# Patient Record
Sex: Female | Born: 1980 | Race: White | Hispanic: No | Marital: Single | Smoking: Never smoker
Health system: Southern US, Community
[De-identification: ages and names within clinical notes are randomized; demographics above are authoritative.]

## PROBLEM LIST (undated history)

## (undated) DIAGNOSIS — C8190 Hodgkin lymphoma, unspecified, unspecified site: Secondary | ICD-10-CM

---
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
## 2002-01-10 ENCOUNTER — Encounter (HOSPITAL_COMMUNITY): Admission: RE | Admit: 2002-01-10 | Discharge: 2002-04-10 | Payer: Self-pay | Admitting: Radiation Oncology

## 2002-02-22 ENCOUNTER — Encounter: Payer: Self-pay | Admitting: Oncology

## 2002-02-22 ENCOUNTER — Ambulatory Visit (HOSPITAL_COMMUNITY): Admission: RE | Admit: 2002-02-22 | Discharge: 2002-02-22 | Payer: Self-pay | Admitting: Oncology

## 2002-05-31 ENCOUNTER — Encounter: Payer: Self-pay | Admitting: Oncology

## 2002-05-31 ENCOUNTER — Ambulatory Visit (HOSPITAL_COMMUNITY): Admission: RE | Admit: 2002-05-31 | Discharge: 2002-05-31 | Payer: Self-pay | Admitting: Oncology

## 2002-08-30 ENCOUNTER — Ambulatory Visit (HOSPITAL_COMMUNITY): Admission: RE | Admit: 2002-08-30 | Discharge: 2002-08-30 | Payer: Self-pay | Admitting: Oncology

## 2002-08-30 ENCOUNTER — Encounter: Payer: Self-pay | Admitting: Oncology

## 2002-11-16 ENCOUNTER — Encounter: Payer: Self-pay | Admitting: Oncology

## 2002-11-16 ENCOUNTER — Ambulatory Visit (HOSPITAL_COMMUNITY): Admission: RE | Admit: 2002-11-16 | Discharge: 2002-11-16 | Payer: Self-pay | Admitting: Oncology

## 2002-11-23 ENCOUNTER — Encounter: Payer: Self-pay | Admitting: Hematology & Oncology

## 2002-11-23 ENCOUNTER — Ambulatory Visit (HOSPITAL_COMMUNITY): Admission: RE | Admit: 2002-11-23 | Discharge: 2002-11-23 | Payer: Self-pay | Admitting: Hematology & Oncology

## 2003-02-27 ENCOUNTER — Ambulatory Visit (HOSPITAL_COMMUNITY): Admission: RE | Admit: 2003-02-27 | Discharge: 2003-02-27 | Payer: Self-pay | Admitting: Oncology

## 2003-02-27 ENCOUNTER — Encounter: Payer: Self-pay | Admitting: Oncology

## 2003-02-28 ENCOUNTER — Ambulatory Visit: Admission: RE | Admit: 2003-02-28 | Discharge: 2003-02-28 | Payer: Self-pay | Admitting: Radiation Oncology

## 2003-06-06 ENCOUNTER — Ambulatory Visit (HOSPITAL_COMMUNITY): Admission: RE | Admit: 2003-06-06 | Discharge: 2003-06-06 | Payer: Self-pay | Admitting: Oncology

## 2003-06-06 ENCOUNTER — Encounter: Payer: Self-pay | Admitting: Oncology

## 2003-06-07 ENCOUNTER — Ambulatory Visit (HOSPITAL_COMMUNITY): Admission: RE | Admit: 2003-06-07 | Discharge: 2003-06-07 | Payer: Self-pay | Admitting: Oncology

## 2003-06-07 ENCOUNTER — Encounter: Payer: Self-pay | Admitting: Oncology

## 2003-10-15 ENCOUNTER — Ambulatory Visit (HOSPITAL_COMMUNITY): Admission: RE | Admit: 2003-10-15 | Discharge: 2003-10-15 | Payer: Self-pay | Admitting: Oncology

## 2004-02-27 ENCOUNTER — Ambulatory Visit (HOSPITAL_COMMUNITY): Admission: RE | Admit: 2004-02-27 | Discharge: 2004-02-27 | Payer: Self-pay | Admitting: Oncology

## 2004-03-12 ENCOUNTER — Ambulatory Visit: Admission: RE | Admit: 2004-03-12 | Discharge: 2004-03-12 | Payer: Self-pay | Admitting: Radiation Oncology

## 2004-10-06 ENCOUNTER — Ambulatory Visit (HOSPITAL_COMMUNITY): Admission: RE | Admit: 2004-10-06 | Discharge: 2004-10-06 | Payer: Self-pay | Admitting: Oncology

## 2004-10-23 ENCOUNTER — Ambulatory Visit: Payer: Self-pay | Admitting: Oncology

## 2005-04-22 ENCOUNTER — Ambulatory Visit: Payer: Self-pay | Admitting: Oncology

## 2005-04-23 ENCOUNTER — Ambulatory Visit (HOSPITAL_COMMUNITY): Admission: RE | Admit: 2005-04-23 | Discharge: 2005-04-23 | Payer: Self-pay | Admitting: Oncology

## 2005-04-30 ENCOUNTER — Ambulatory Visit: Admission: RE | Admit: 2005-04-30 | Discharge: 2005-04-30 | Payer: Self-pay | Admitting: Radiation Oncology

## 2005-10-21 ENCOUNTER — Ambulatory Visit: Payer: Self-pay | Admitting: Oncology

## 2005-10-22 ENCOUNTER — Ambulatory Visit (HOSPITAL_COMMUNITY): Admission: RE | Admit: 2005-10-22 | Discharge: 2005-10-22 | Payer: Self-pay | Admitting: Oncology

## 2006-01-24 ENCOUNTER — Ambulatory Visit (HOSPITAL_COMMUNITY): Admission: RE | Admit: 2006-01-24 | Discharge: 2006-01-24 | Payer: Self-pay | Admitting: Oncology

## 2006-04-25 ENCOUNTER — Ambulatory Visit: Payer: Self-pay | Admitting: Oncology

## 2006-04-28 ENCOUNTER — Ambulatory Visit (HOSPITAL_COMMUNITY): Admission: RE | Admit: 2006-04-28 | Discharge: 2006-04-28 | Payer: Self-pay | Admitting: Oncology

## 2006-04-28 LAB — LACTATE DEHYDROGENASE: LDH: 102 U/L (ref 94–250)

## 2006-04-28 LAB — CBC WITH DIFFERENTIAL/PLATELET
Eosinophils Absolute: 0.2 10*3/uL (ref 0.0–0.5)
HCT: 36.7 % (ref 34.8–46.6)
HGB: 12.6 g/dL (ref 11.6–15.9)
LYMPH%: 34 % (ref 14.0–48.0)
MONO#: 0.5 10*3/uL (ref 0.1–0.9)
NEUT#: 3.2 10*3/uL (ref 1.5–6.5)
NEUT%: 53.7 % (ref 39.6–76.8)
Platelets: 270 10*3/uL (ref 145–400)
WBC: 5.9 10*3/uL (ref 3.9–10.0)
lymph#: 2 10*3/uL (ref 0.9–3.3)

## 2006-04-28 LAB — COMPREHENSIVE METABOLIC PANEL
ALT: 8 U/L (ref 0–40)
AST: 11 U/L (ref 0–37)
Albumin: 4 g/dL (ref 3.5–5.2)
BUN: 9 mg/dL (ref 6–23)
CO2: 24 mEq/L (ref 19–32)
Calcium: 9 mg/dL (ref 8.4–10.5)
Chloride: 101 mEq/L (ref 96–112)
Creatinine, Ser: 0.91 mg/dL (ref 0.40–1.20)
Potassium: 4.1 mEq/L (ref 3.5–5.3)

## 2006-10-26 ENCOUNTER — Ambulatory Visit: Payer: Self-pay | Admitting: Oncology

## 2006-10-31 ENCOUNTER — Ambulatory Visit (HOSPITAL_COMMUNITY): Admission: RE | Admit: 2006-10-31 | Discharge: 2006-10-31 | Payer: Self-pay | Admitting: Oncology

## 2006-10-31 LAB — COMPREHENSIVE METABOLIC PANEL
ALT: 9 U/L (ref 0–35)
AST: 11 U/L (ref 0–37)
Albumin: 4.4 g/dL (ref 3.5–5.2)
CO2: 24 mEq/L (ref 19–32)
Calcium: 9.4 mg/dL (ref 8.4–10.5)
Chloride: 106 mEq/L (ref 96–112)
Potassium: 4 mEq/L (ref 3.5–5.3)
Sodium: 140 mEq/L (ref 135–145)
Total Protein: 7.1 g/dL (ref 6.0–8.3)

## 2006-10-31 LAB — CBC WITH DIFFERENTIAL/PLATELET
BASO%: 0.4 % (ref 0.0–2.0)
Eosinophils Absolute: 0.1 10*3/uL (ref 0.0–0.5)
LYMPH%: 31.3 % (ref 14.0–48.0)
MCHC: 33.6 g/dL (ref 32.0–36.0)
MONO#: 0.4 10*3/uL (ref 0.1–0.9)
NEUT#: 3.6 10*3/uL (ref 1.5–6.5)
Platelets: 305 10*3/uL (ref 145–400)
RBC: 4.51 10*6/uL (ref 3.70–5.32)
WBC: 6.1 10*3/uL (ref 3.9–10.0)
lymph#: 1.9 10*3/uL (ref 0.9–3.3)

## 2006-10-31 LAB — LACTATE DEHYDROGENASE: LDH: 126 U/L (ref 94–250)

## 2006-10-31 LAB — ERYTHROCYTE SEDIMENTATION RATE: Sed Rate: 5 mm/hr (ref 0–30)

## 2007-10-25 ENCOUNTER — Ambulatory Visit: Payer: Self-pay | Admitting: Oncology

## 2007-10-27 ENCOUNTER — Ambulatory Visit (HOSPITAL_COMMUNITY): Admission: RE | Admit: 2007-10-27 | Discharge: 2007-10-27 | Payer: Self-pay | Admitting: Oncology

## 2007-10-27 LAB — COMPREHENSIVE METABOLIC PANEL
Alkaline Phosphatase: 82 U/L (ref 39–117)
CO2: 22 mEq/L (ref 19–32)
Creatinine, Ser: 0.79 mg/dL (ref 0.40–1.20)
Glucose, Bld: 72 mg/dL (ref 70–99)
Sodium: 140 mEq/L (ref 135–145)
Total Bilirubin: 0.7 mg/dL (ref 0.3–1.2)
Total Protein: 7.5 g/dL (ref 6.0–8.3)

## 2007-10-27 LAB — CBC WITH DIFFERENTIAL/PLATELET
Basophils Absolute: 0 10*3/uL (ref 0.0–0.1)
EOS%: 1.7 % (ref 0.0–7.0)
Eosinophils Absolute: 0.1 10*3/uL (ref 0.0–0.5)
HGB: 13.8 g/dL (ref 11.6–15.9)
LYMPH%: 35.5 % (ref 14.0–48.0)
MCH: 30 pg (ref 26.0–34.0)
MCV: 87.6 fL (ref 81.0–101.0)
MONO%: 10.2 % (ref 0.0–13.0)
Platelets: 257 10*3/uL (ref 145–400)
RBC: 4.61 10*6/uL (ref 3.70–5.32)
RDW: 12.8 % (ref 11.3–14.5)

## 2007-10-27 LAB — TSH: TSH: 3.384 u[IU]/mL (ref 0.350–5.500)

## 2007-10-27 LAB — LACTATE DEHYDROGENASE: LDH: 140 U/L (ref 94–250)

## 2008-10-22 ENCOUNTER — Ambulatory Visit (HOSPITAL_COMMUNITY): Admission: RE | Admit: 2008-10-22 | Discharge: 2008-10-22 | Payer: Self-pay | Admitting: Oncology

## 2008-11-11 ENCOUNTER — Ambulatory Visit: Payer: Self-pay | Admitting: Oncology

## 2008-11-13 LAB — COMPREHENSIVE METABOLIC PANEL
Alkaline Phosphatase: 60 U/L (ref 39–117)
BUN: 12 mg/dL (ref 6–23)
Creatinine, Ser: 0.87 mg/dL (ref 0.40–1.20)
Glucose, Bld: 64 mg/dL — ABNORMAL LOW (ref 70–99)
Sodium: 139 mEq/L (ref 135–145)
Total Bilirubin: 0.4 mg/dL (ref 0.3–1.2)

## 2008-11-13 LAB — CBC WITH DIFFERENTIAL/PLATELET
Basophils Absolute: 0 10*3/uL (ref 0.0–0.1)
EOS%: 1.1 % (ref 0.0–7.0)
HCT: 40.3 % (ref 34.8–46.6)
HGB: 14 g/dL (ref 11.6–15.9)
LYMPH%: 32.7 % (ref 14.0–48.0)
MCH: 31.3 pg (ref 26.0–34.0)
MCHC: 34.7 g/dL (ref 32.0–36.0)
MONO#: 0.6 10*3/uL (ref 0.1–0.9)
NEUT%: 57.5 % (ref 39.6–76.8)
Platelets: 256 10*3/uL (ref 145–400)
lymph#: 2.5 10*3/uL (ref 0.9–3.3)

## 2008-11-13 LAB — MORPHOLOGY: PLT EST: ADEQUATE

## 2008-11-13 LAB — ERYTHROCYTE SEDIMENTATION RATE: Sed Rate: 5 mm/hr (ref 0–30)

## 2008-11-13 LAB — LACTATE DEHYDROGENASE: LDH: 119 U/L (ref 94–250)

## 2009-05-05 ENCOUNTER — Ambulatory Visit: Payer: Self-pay | Admitting: Oncology

## 2009-05-08 LAB — COMPREHENSIVE METABOLIC PANEL
AST: 17 U/L (ref 0–37)
Albumin: 3.9 g/dL (ref 3.5–5.2)
Alkaline Phosphatase: 62 U/L (ref 39–117)
Glucose, Bld: 90 mg/dL (ref 70–99)
Potassium: 3.5 mEq/L (ref 3.5–5.3)
Sodium: 136 mEq/L (ref 135–145)
Total Bilirubin: 0.7 mg/dL (ref 0.3–1.2)
Total Protein: 7.5 g/dL (ref 6.0–8.3)

## 2009-05-08 LAB — CBC WITH DIFFERENTIAL/PLATELET
Basophils Absolute: 0 10*3/uL (ref 0.0–0.1)
Eosinophils Absolute: 0.3 10*3/uL (ref 0.0–0.5)
HCT: 38.2 % (ref 34.8–46.6)
MCV: 89.6 fL (ref 79.5–101.0)
MONO#: 0.6 10*3/uL (ref 0.1–0.9)
MONO%: 9.5 % (ref 0.0–14.0)
Platelets: 264 10*3/uL (ref 145–400)
RDW: 12.6 % (ref 11.2–14.5)

## 2009-05-09 LAB — SEDIMENTATION RATE: Sed Rate: 5 mm/hr (ref 0–22)

## 2010-03-14 IMAGING — CT CT ABDOMEN W/ CM
2 of 5 series · 12 of 32 positions shown, 18 images · IV contrast (agent unspecified)
Comparison: CT chest abdomen pelvis 10/27/2007

CT NECK

CLINICAL DATA: Hodgkin's lymphoma diagnosed in 8448.  Chemo
radiation therapy completed.

CT NECK, CHEST, ABDOMEN AND PELVIS WITH CONTRAST
TECHNIQUE: Multidetector CT imaging of the neck, chest, abdomen and
pelvis was performed using the standard protocol following the
bolus administration of intravenous contrast.
Contrast: 100 ml  Omni 300

[Series 2: cap 5.0 b40f st · axial · 0.68mm/px · z∈[+1238,+1744]mm · 9 of 127 slices shown, 15 images]
[im 13/127  soft-tissue]
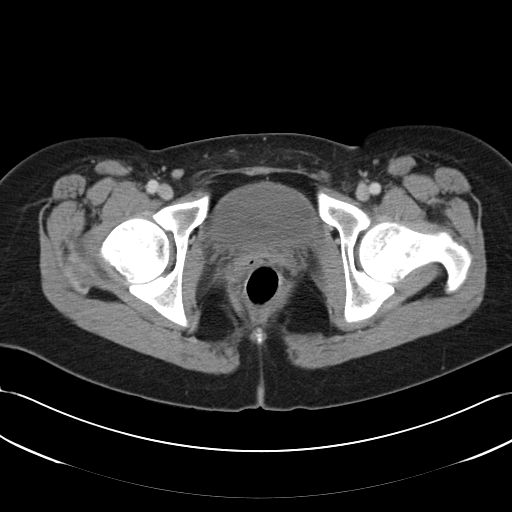
[im 13/127  bone]
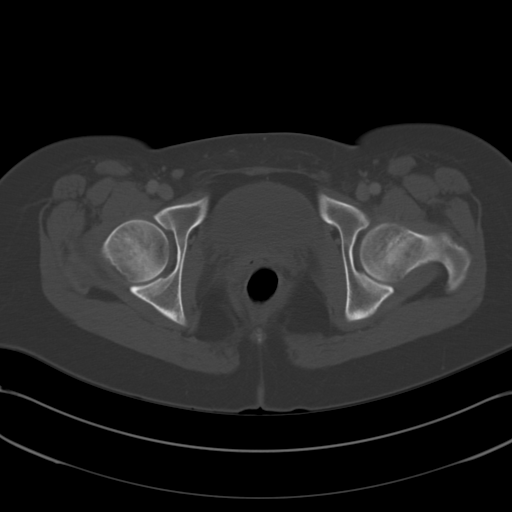
[im 26/127  soft-tissue]
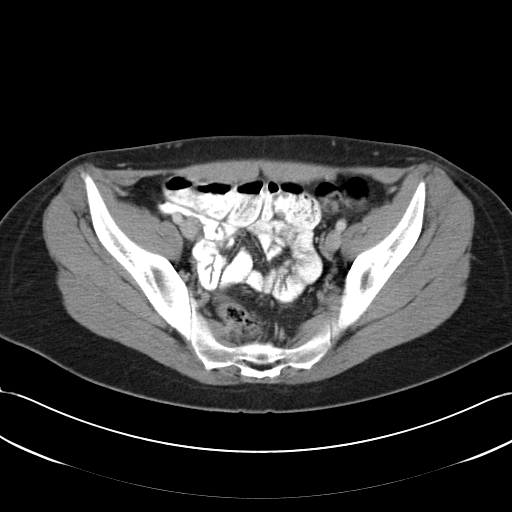
[im 38/127  soft-tissue]
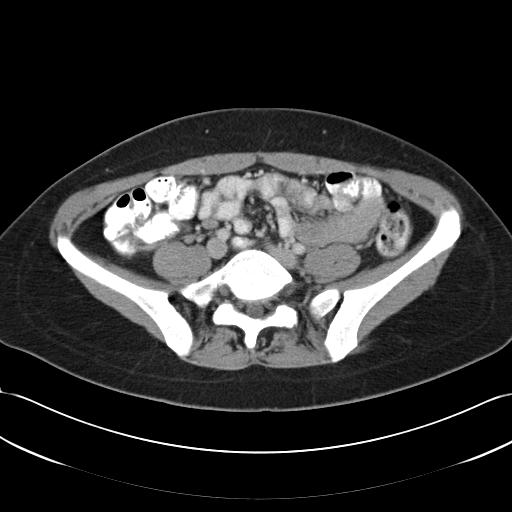
[im 51/127  soft-tissue]
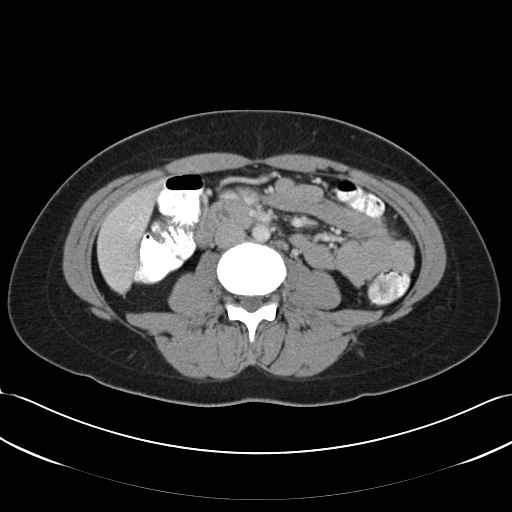
[im 64/127  soft-tissue]
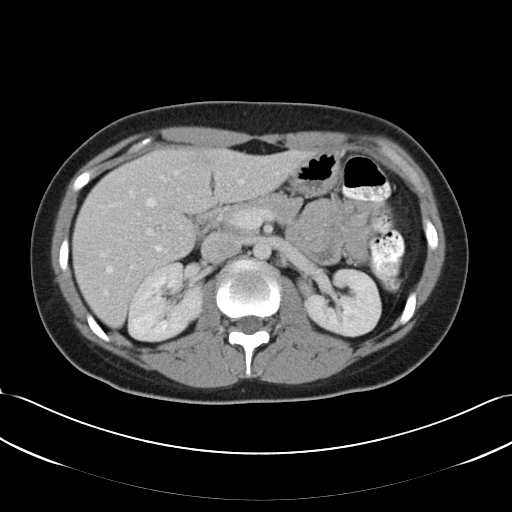
[im 76/127  soft-tissue]
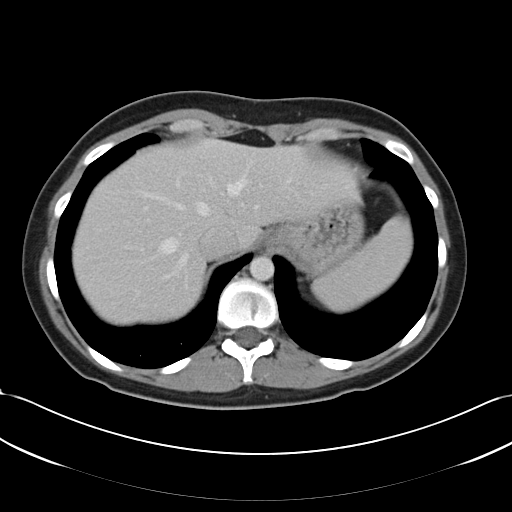
[im 76/127  lung]
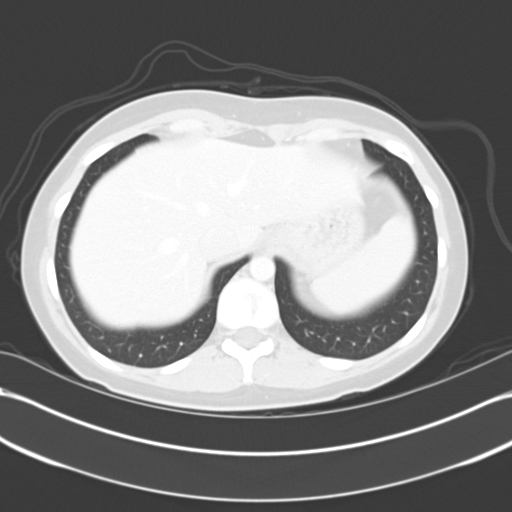
[im 89/127  soft-tissue]
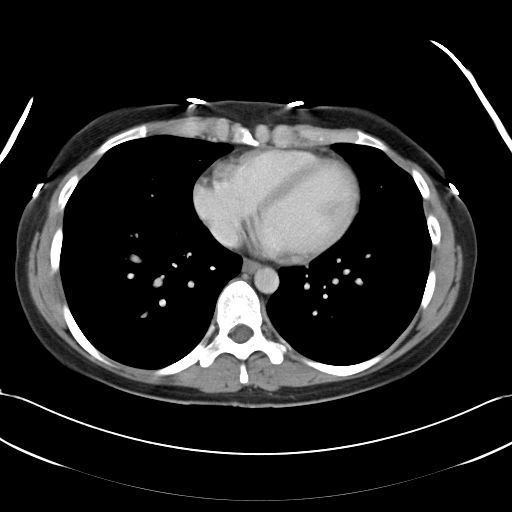
[im 89/127  lung]
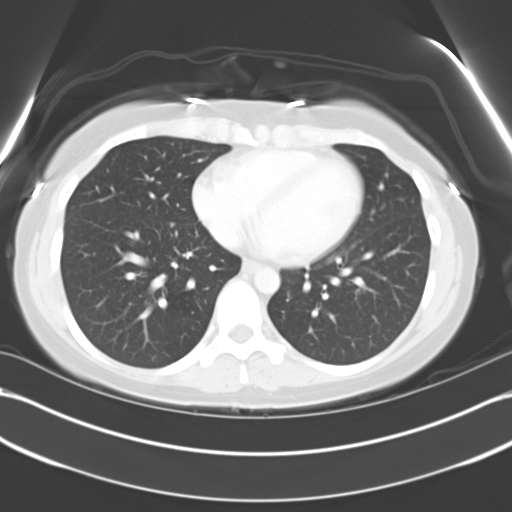
[im 101/127  soft-tissue]
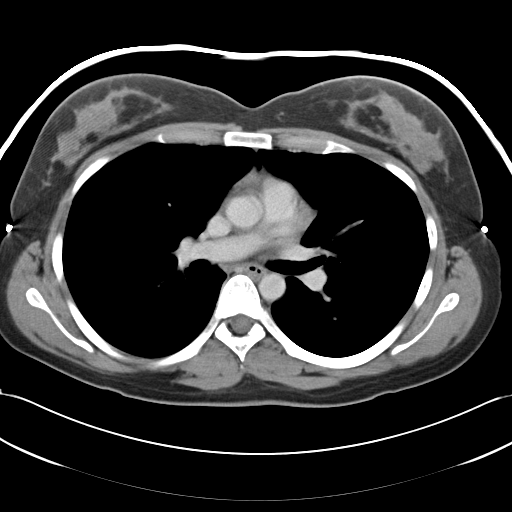
[im 101/127  lung]
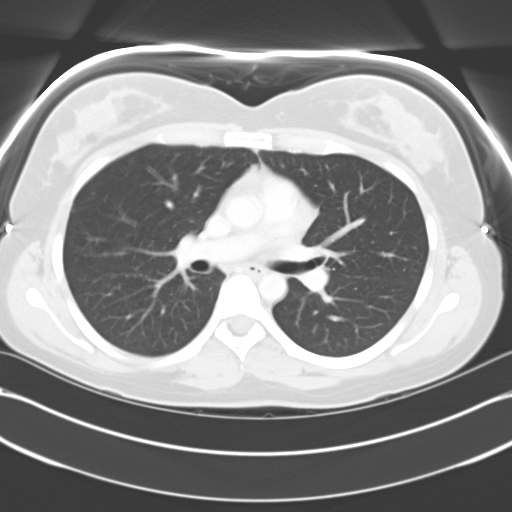
[im 114/127  soft-tissue]
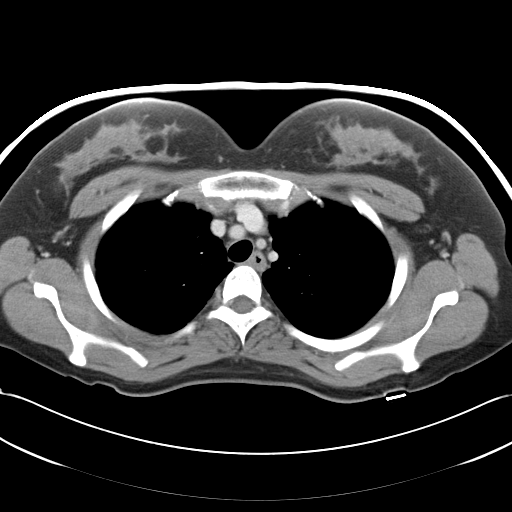
[im 114/127  lung]
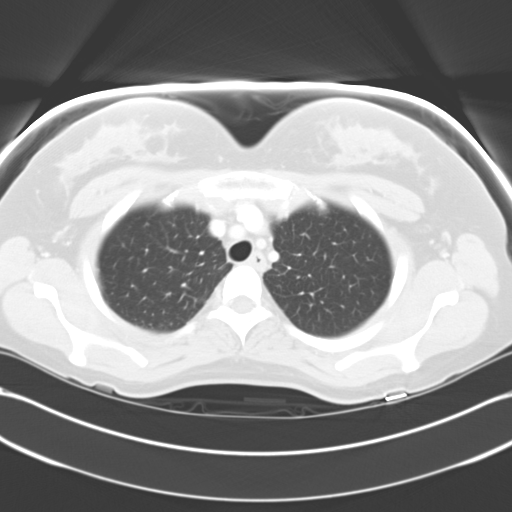
[im 114/127  bone]
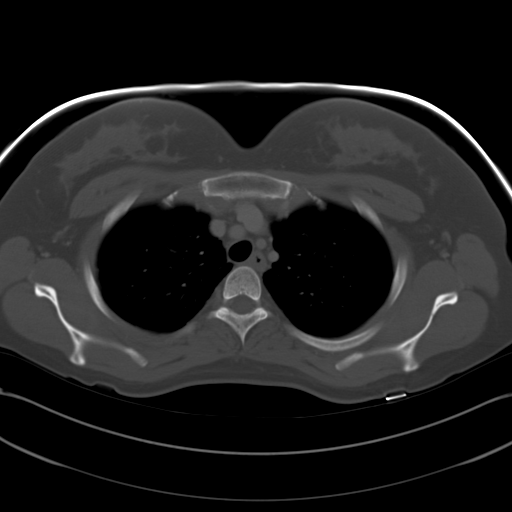

[Series 3: neck_routine 3.0 b40s st · axial · 0.39mm/px · z∈[+1752,+1836]mm · 3 of 84 slices shown]
[im 14/84  soft-tissue]
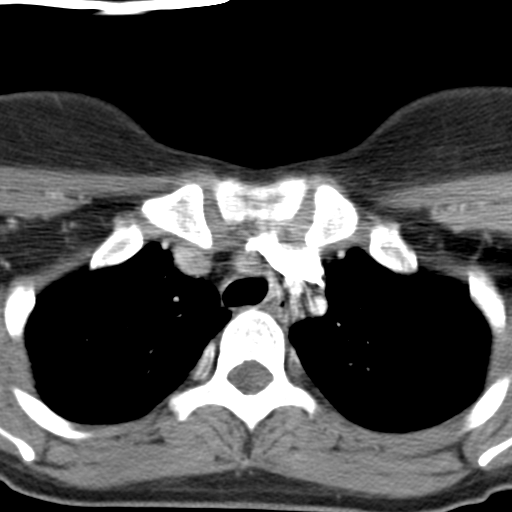
[im 28/84  soft-tissue]
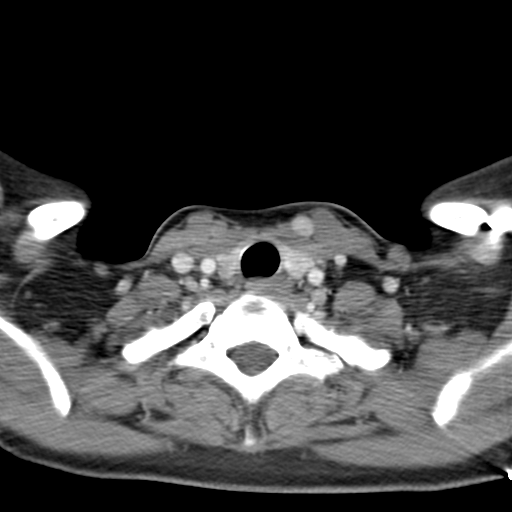
[im 42/84  soft-tissue]
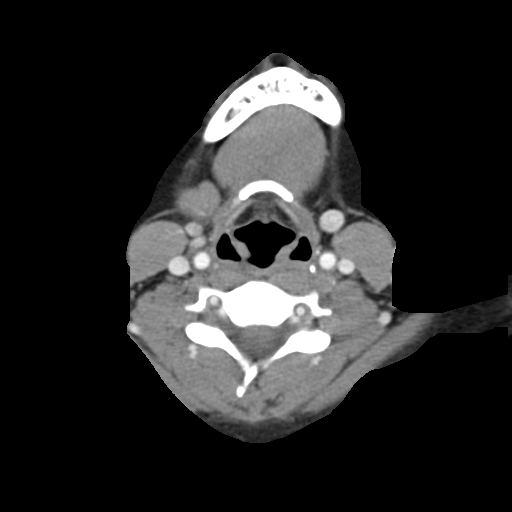

[12 of 32 positions shown; findings below may reference images not displayed]

FINDINGS: No pathologically enlarged lymph nodes along the jugular
digastric chain or submental positions.  There are bilateral sub
centimeter level II nodes which are not pathologic by CT size
criteria.  These are unchanged from prior.  The parapharyngeal
mucosa appears symmetric and normal.  The hypopharynx and glottis
are normal.

Limited view of the brain shows no mass lesion.  Orbits appear
normal.
IMPRESSION: No evidence of lymphoma recurrence.

CT CHEST
FINDINGS: No chest wall abnormality.

No evidence of axillary, supraclavicular, mediastinal, hilar
lymphadenopathy.  A small amount of ill-defined tissue conforming
to the triangular shape of the anterior mediastinum consistent with
residual thymus.  This is decreased in prominence compared to
prior.  No pericardial effusion.

No suspicious pulmonary nodules.  The airway appears normal.
IMPRESSION: No lymphoma recurrence within the thorax.

CT ABDOMEN
FINDINGS: No focal hepatic lesion.  There is fatty infiltration
along the falciform ligament.  The gallbladder, pancreas, spleen,
adrenal glands, and kidneys appear normal.

The stomach, small bowel, appendix, and cecum appear normal.  Colon
appears normal.

Abdominal aorta is normal caliber.  No evidence of retroperitoneal
lymphadenopathy.  There are small periaortic lymph nodes which  are
not pathologically enlarged measuring less than 10 mm and are
unchanged from prior.
IMPRESSION: 1. No evidence of lymphoma recurrence within the abdomen.
2.  Stable small sub centimeter periaortic retroperitoneal lymph
nodes are not changed in size compared to prior.

CT PELVIS
FINDINGS: No free fluid in the pelvis.  The bladder and uterus
appear normal.  There is a low density 25 mm cyst associated with
the right ovary which appears slightly smaller than 29 mm on prior.
No evidence of pelvic lymphadenopathy.  Review of  bone windows
demonstrates no aggressive osseous lesions.
IMPRESSION: 1. No evidence of lymphoma recurrence within the pelvis.
2.  Simple cyst associated with the right ovary likely represents a
functional ovarian cyst.

## 2010-05-05 ENCOUNTER — Ambulatory Visit: Payer: Self-pay | Admitting: Oncology

## 2010-05-07 LAB — CBC WITH DIFFERENTIAL/PLATELET
BASO%: 0.5 % (ref 0.0–2.0)
Basophils Absolute: 0 10*3/uL (ref 0.0–0.1)
Eosinophils Absolute: 0.2 10*3/uL (ref 0.0–0.5)
HCT: 40 % (ref 34.8–46.6)
HGB: 13.6 g/dL (ref 11.6–15.9)
LYMPH%: 41.5 % (ref 14.0–49.7)
MONO#: 0.5 10*3/uL (ref 0.1–0.9)
NEUT#: 2.6 10*3/uL (ref 1.5–6.5)
NEUT%: 45.6 % (ref 38.4–76.8)
Platelets: 260 10*3/uL (ref 145–400)
WBC: 5.6 10*3/uL (ref 3.9–10.3)
lymph#: 2.3 10*3/uL (ref 0.9–3.3)

## 2010-05-08 LAB — COMPREHENSIVE METABOLIC PANEL
ALT: <8 U/L (ref 0–35)
BUN: 9 mg/dL (ref 6–23)
CO2: 19 mEq/L (ref 19–32)
Calcium: 10.1 mg/dL (ref 8.4–10.5)
Chloride: 107 mEq/L (ref 96–112)
Creatinine, Ser: 0.83 mg/dL (ref 0.40–1.20)
Glucose, Bld: 78 mg/dL (ref 70–99)

## 2010-05-08 LAB — TSH: TSH: 3.582 u[IU]/mL (ref 0.350–4.500)

## 2010-05-08 LAB — LACTATE DEHYDROGENASE: LDH: 118 U/L (ref 94–250)

## 2010-11-01 ENCOUNTER — Encounter: Payer: Self-pay | Admitting: Oncology

## 2011-01-25 LAB — PREGNANCY, URINE: Preg Test, Ur: NEGATIVE

## 2011-04-30 ENCOUNTER — Ambulatory Visit
Admission: RE | Admit: 2011-04-30 | Discharge: 2011-04-30 | Disposition: A | Discharge status: Home / Self Care | Payer: BC Managed Care – PPO | Source: Ambulatory Visit | Attending: Radiation Oncology | Admitting: Radiation Oncology

## 2011-07-16 ENCOUNTER — Encounter (HOSPITAL_BASED_OUTPATIENT_CLINIC_OR_DEPARTMENT_OTHER): Payer: BC Managed Care – PPO | Admitting: Oncology

## 2011-07-16 ENCOUNTER — Other Ambulatory Visit: Payer: Self-pay | Admitting: Oncology

## 2011-07-16 DIAGNOSIS — F341 Dysthymic disorder: Secondary | ICD-10-CM

## 2011-07-16 DIAGNOSIS — C8119 Nodular sclerosis classical Hodgkin lymphoma, extranodal and solid organ sites: Secondary | ICD-10-CM

## 2011-07-16 LAB — T3 UPTAKE: T3 Uptake: 32 % (ref 22.5–37.0)

## 2011-07-16 LAB — CBC WITH DIFFERENTIAL/PLATELET
Basophils Absolute: 0 10*3/uL (ref 0.0–0.1)
EOS%: 2.2 % (ref 0.0–7.0)
Eosinophils Absolute: 0.1 10*3/uL (ref 0.0–0.5)
HGB: 14.1 g/dL (ref 11.6–15.9)
MCH: 29.8 pg (ref 25.1–34.0)
NEUT#: 2.3 10*3/uL (ref 1.5–6.5)
RBC: 4.72 10*6/uL (ref 3.70–5.45)
RDW: 12.4 % (ref 11.2–14.5)
lymph#: 1.8 10*3/uL (ref 0.9–3.3)

## 2011-07-16 LAB — COMPREHENSIVE METABOLIC PANEL
AST: 14 U/L (ref 0–37)
Alkaline Phosphatase: 72 U/L (ref 39–117)
BUN: 9 mg/dL (ref 6–23)
Creatinine, Ser: 0.69 mg/dL (ref 0.50–1.10)
Glucose, Bld: 82 mg/dL (ref 70–99)
Total Bilirubin: 0.4 mg/dL (ref 0.3–1.2)

## 2011-07-16 LAB — T4, FREE: Free T4: 1.08 ng/dL (ref 0.80–1.80)

## 2011-07-16 LAB — T3: T3, Total: 99.2 ng/dL (ref 80.0–204.0)

## 2011-07-16 LAB — SEDIMENTATION RATE: Sed Rate: 6 mm/hr (ref 0–22)

## 2011-08-17 LAB — SEDIMENTATION RATE: Sed Rate: 6 mm/hr (ref 0–22)

## 2011-09-13 ENCOUNTER — Telehealth: Payer: Self-pay | Admitting: *Deleted

## 2011-09-13 NOTE — Telephone Encounter (Signed)
left patient voice message to inform the patient of the new date and time on 07-11-2012 for lab and 07-18-2012 for dr.rubin

## 2012-03-13 ENCOUNTER — Telehealth: Payer: Self-pay | Admitting: *Deleted

## 2012-03-13 NOTE — Telephone Encounter (Signed)
per md going to be at [employer] [facility] moved patient appointment to 10-16-201310:00 labs md appointment to follow

## 2012-03-13 NOTE — Telephone Encounter (Signed)
per md going to be at [facility] moved patient appointment to 07-26-2012 10:30am lab one week before the md appointment on 07-19-2012 at 10:00am

## 2012-05-04 ENCOUNTER — Ambulatory Visit
Admission: RE | Admit: 2012-05-04 | Payer: BC Managed Care – PPO | Source: Ambulatory Visit | Admitting: Radiation Oncology

## 2012-05-11 ENCOUNTER — Ambulatory Visit: Payer: BC Managed Care – PPO | Admitting: Radiation Oncology

## 2012-06-15 ENCOUNTER — Ambulatory Visit
Admission: RE | Admit: 2012-06-15 | Discharge: 2012-06-15 | Disposition: A | Discharge status: Home / Self Care | Payer: BC Managed Care – PPO | Source: Ambulatory Visit | Attending: Radiation Oncology | Admitting: Radiation Oncology

## 2012-06-15 ENCOUNTER — Encounter: Payer: Self-pay | Admitting: Radiation Oncology

## 2012-06-15 VITALS — BP 115/60 | HR 72 | Temp 97.9°F | Wt 170.5 lb

## 2012-06-15 DIAGNOSIS — C61 Malignant neoplasm of prostate: Secondary | ICD-10-CM

## 2012-06-15 DIAGNOSIS — C8190 Hodgkin lymphoma, unspecified, unspecified site: Secondary | ICD-10-CM

## 2012-06-15 DIAGNOSIS — R300 Dysuria: Secondary | ICD-10-CM

## 2012-06-15 DIAGNOSIS — K5900 Constipation, unspecified: Secondary | ICD-10-CM

## 2012-06-15 HISTORY — DX: Hodgkin lymphoma, unspecified, unspecified site: C81.90

## 2012-06-15 MED ORDER — POLYETHYLENE GLYCOL 3350 17 GM/SCOOP PO POWD: 17.0000 g | Freq: Every day | ORAL | Status: DC

## 2012-06-15 NOTE — Progress Notes (Signed)
  Radiation Oncology         [phone] ________________________________  Name: Lauren Lane MRN: [id number]  Date: 06/15/2012  DOB: 08-08-1981  Follow-Up Visit Note  CC: No primary provider on file.  Pierce Crane, MD  CC: Pierce Crane, M.D., F.R.C.P.C.   DIAGNOSIS:  This is a 31 year old woman status post ABVD x4 followed by involved-field radiotherapy for stage IIA nodular sclerosing Hodgkin disease.  INTERVAL SINCE LAST RADIATION:  Ten years  NARRATIVE:  Lauren Lane returns today for routine follow-up assessment.  She is essentially without complaint.  She denies B symptoms.  She lives in [place] and is currently attending nursing school. She had a successful pregnancy this past year and has a 86-month-old son.  ALLERGIES:   has no allergies on file.  Meds: Current Outpatient Prescriptions  Medication Sig Dispense Refill  . Prenatal Vitamins (DIS) TABS Take by mouth.        Physical Findings: The patient is in no acute distress. Patient is alert and oriented.  weight is 170 lb 8 oz (77.338 kg). Her temperature is 97.9 F (36.6 C). Her blood pressure is 115/60 and her pulse is 72. Marland Kitchen Oropharynx is symmetric the neck is free of any appreciable lymphadenopathy lungs are clear heart is regular further detailed physical exams deferred today. No significant changes.  Lab Findings: TSH was normal in October 2012 as screening for postradiation hypothyroidism.  Impression/Plan:  The patient has no evidence of disease. Now that she is 10 years status post radiation, she should probably consider enrollment in a high-risk breast cancer prevention clinic. Since she was in [place], we will make a referral to [facility] high-risk breast cancer prevention program. Otherwise, short-term radiation oncology clinic here for routine followup in one year. _____________________________________  Lauren Lane, M.D.

## 2012-06-15 NOTE — Progress Notes (Signed)
Patient here for routine yearly follow up radiation for  Treatment of stage II non sclerosing Hodkin's disease.Patient is looking well and feeling good.Has a one month old son.

## 2012-07-04 ENCOUNTER — Telehealth: Payer: Self-pay | Admitting: *Deleted

## 2012-07-04 NOTE — Telephone Encounter (Signed)
CALLED DR. VICTORIA SEAWALL'S OFFICE TO ARRANGE AN APPT. FOR THIS PATIENT TO BE PUT IN A BREAST CANCER PREVENTION PROGRAM, LVM FOR A RETURN CALL

## 2012-07-05 ENCOUNTER — Telehealth: Payer: Self-pay | Admitting: *Deleted

## 2012-07-05 NOTE — Telephone Encounter (Signed)
patient called in and rescheduled her appointment to 08-16-2012 at 2:00pm

## 2012-07-11 ENCOUNTER — Other Ambulatory Visit: Payer: BC Managed Care – PPO | Admitting: Lab

## 2012-07-18 ENCOUNTER — Ambulatory Visit: Payer: BC Managed Care – PPO | Admitting: Oncology

## 2012-07-18 ENCOUNTER — Other Ambulatory Visit: Payer: Self-pay | Admitting: *Deleted

## 2012-07-18 DIAGNOSIS — C8190 Hodgkin lymphoma, unspecified, unspecified site: Secondary | ICD-10-CM

## 2012-07-19 ENCOUNTER — Other Ambulatory Visit (HOSPITAL_BASED_OUTPATIENT_CLINIC_OR_DEPARTMENT_OTHER): Payer: BC Managed Care – PPO | Admitting: Lab

## 2012-07-19 DIAGNOSIS — C8190 Hodgkin lymphoma, unspecified, unspecified site: Secondary | ICD-10-CM

## 2012-07-19 LAB — CBC WITH DIFFERENTIAL/PLATELET
BASO%: 0.5 % (ref 0.0–2.0)
HCT: 41 % (ref 34.8–46.6)
MCHC: 32.9 g/dL (ref 31.5–36.0)
MONO#: 0.5 10*3/uL (ref 0.1–0.9)
NEUT%: 47.2 % (ref 38.4–76.8)
WBC: 5.8 10*3/uL (ref 3.9–10.3)
lymph#: 2.4 10*3/uL (ref 0.9–3.3)

## 2012-07-19 LAB — COMPREHENSIVE METABOLIC PANEL (CC13)
ALT: 21 U/L (ref 0–55)
Albumin: 3.9 g/dL (ref 3.5–5.0)
CO2: 23 mEq/L (ref 22–29)
Calcium: 10.5 mg/dL — ABNORMAL HIGH (ref 8.4–10.4)
Chloride: 108 mEq/L — ABNORMAL HIGH (ref 98–107)
Creatinine: 0.8 mg/dL (ref 0.6–1.1)
Sodium: 141 mEq/L (ref 136–145)
Total Protein: 7.3 g/dL (ref 6.4–8.3)

## 2012-07-19 LAB — T3: T3, Total: 111.2 ng/dL (ref 80.0–204.0)

## 2012-07-26 ENCOUNTER — Ambulatory Visit: Payer: BC Managed Care – PPO | Admitting: Oncology

## 2012-08-16 ENCOUNTER — Ambulatory Visit (HOSPITAL_BASED_OUTPATIENT_CLINIC_OR_DEPARTMENT_OTHER): Payer: BC Managed Care – PPO | Admitting: Oncology

## 2012-08-16 ENCOUNTER — Telehealth: Payer: Self-pay | Admitting: Oncology

## 2012-08-16 VITALS — BP 101/68 | HR 82 | Temp 98.3°F | Resp 20 | Ht 66.0 in | Wt 162.9 lb

## 2012-08-16 DIAGNOSIS — C8190 Hodgkin lymphoma, unspecified, unspecified site: Secondary | ICD-10-CM

## 2012-08-16 NOTE — Telephone Encounter (Signed)
gve the pt her nov 2014 appt calendar along with the mammo appt. Pt is aware she will be contacted with the mri  Breast appt.

## 2012-08-18 ENCOUNTER — Telehealth: Payer: Self-pay | Admitting: Oncology

## 2012-08-18 NOTE — Telephone Encounter (Signed)
lmonvm adviisng the pt of her mri breast appt at [facility]

## 2012-11-06 NOTE — Progress Notes (Signed)
Hematology and Oncology Follow Up Visit  Lauren Lane [id number] [birth year] 30 y.o. 11/06/2012 9:37 PM   DIAGNOSIS:   Encounter Diagnosis  Name Primary?  . Hodgkin's disease Yes  Stage 2 NS HD, diagnosed 11/12, s/p ABVD x 4, followed by involved field xrt.   PAST THERAPY:  As above   Interim History:  Lauren Lane returns for f/u, she is doing well she has a young child and is going to nursing school. She has nonew complaints. She has not had a mammogram to date. Her thyroid function has been normal. Overall PFS has been excellent.  Medications: I have reviewed the patient's current medications.  Allergies: No Known Allergies  Past Medical History, Surgical history, Social history, and Family History were reviewed and updated.  Review of Systems: Constitutional:  Negative for fever, chills, night sweats, anorexia, weight loss, pain. Cardiovascular: no chest pain or dyspnea on exertion Respiratory: no cough, shortness of breath, or wheezing Neurological: no TIA or stroke symptoms Dermatological: negative ENT: negative Skin Gastrointestinal: negative Genito-Urinary: negative Hematological and Lymphatic: negative Breast: negative Musculoskeletal: negative Remaining ROS negative.  Physical Exam:  Blood pressure 101/68, pulse 82, temperature 98.3 F (36.8 C), temperature source Oral, resp. rate 20, height 5\' 6"  (1.676 m), weight 162 lb 14.4 oz (73.891 kg).  ECOG: 0   HEENT:  Sclerae anicteric, conjunctivae pink.  Oropharynx clear.  No mucositis or candidiasis.  Nodes:  No cervical, supraclavicular, or axillary lymphadenopathy palpated.  Breast Exam:  Right breast is benign.  No masses, discharge, skin change, or nipple inversion.  Left breast is benign.  No masses, discharge, skin change, or nipple inversion..  Lungs:  Clear to auscultation bilaterally.  No crackles, rhonchi, or wheezes.  Heart:  Regular rate and rhythm.  Abdomen:  Soft, nontender.  Positive bowel sounds.  No  organomegaly or masses palpated.  Musculoskeletal:  No focal spinal tenderness to palpation.  Extremities:  Benign.  No peripheral edema or cyanosis.  Skin:  Benign.  Neuro:  Nonfocal.    Lab Results: Lab Results  Component Value Date   WBC 5.8 07/19/2012   HGB 13.5 07/19/2012   HCT 41.0 07/19/2012   MCV 85.4 07/19/2012   PLT 256 07/19/2012     Chemistry      Component Value Date/Time   NA 141 07/19/2012 0948   NA 141 07/16/2011 0936   NA 141 07/16/2011 0936   K 4.0 07/19/2012 0948   K 4.2 07/16/2011 0936   K 4.2 07/16/2011 0936   CL 108* 07/19/2012 0948   CL 105 07/16/2011 0936   CL 105 07/16/2011 0936   CO2 23 07/19/2012 0948   CO2 27 07/16/2011 0936   CO2 27 07/16/2011 0936   BUN 8.0 07/19/2012 0948   BUN 9 07/16/2011 0936   BUN 9 07/16/2011 0936   CREATININE 0.8 07/19/2012 0948   CREATININE 0.69 07/16/2011 0936   CREATININE 0.69 07/16/2011 0936      Component Value Date/Time   CALCIUM 10.5* 07/19/2012 0948   CALCIUM 9.7 07/16/2011 0936   CALCIUM 9.7 07/16/2011 0936   ALKPHOS 111 07/19/2012 0948   ALKPHOS 72 07/16/2011 0936   ALKPHOS 72 07/16/2011 0936   AST 21 07/19/2012 0948   AST 14 07/16/2011 0936   AST 14 07/16/2011 0936   ALT 21 07/19/2012 0948   ALT 10 07/16/2011 0936   ALT 10 07/16/2011 0936   BILITOT 0.50 07/19/2012 0948   BILITOT 0.4 07/16/2011 0936   BILITOT 0.4 07/16/2011 0936  Radiological Studies:  No results found.   IMPRESSIONS AND PLAN: A 32 y.o. female with   With Hx of stage II HD , diagnosed and treated in 2002. We discussed f/u plans. She sees Dr Kathrynn Running as well. She will be due for a mammogram next year and might benefit from an MRI q 2 years as well.  Spent more than half the time coordinating care, as well as discussion of BMI and its implications.      Lauren Lane 1/27/20149:37 PM Cell [phone]

## 2012-12-02 ENCOUNTER — Telehealth: Payer: Self-pay | Admitting: Oncology

## 2012-12-02 ENCOUNTER — Encounter: Payer: Self-pay | Admitting: Oncology

## 2012-12-02 NOTE — Telephone Encounter (Signed)
lvm for opt regarding a changed in appt....sent pt letter and appt schedule .Marland Kitchen..former pt of Dr. Marton Redwood

## 2013-06-12 ENCOUNTER — Telehealth: Payer: Self-pay | Admitting: Radiation Oncology

## 2013-06-12 NOTE — Telephone Encounter (Signed)
Lauren Lane requested this Clinical research associate contact the patient to question if she had been seen by [facility]. No answer. Left message requesting return call.

## 2013-06-14 ENCOUNTER — Ambulatory Visit
Admission: RE | Admit: 2013-06-14 | Discharge: 2013-06-14 | Disposition: A | Discharge status: Home / Self Care | Payer: BC Managed Care – PPO | Source: Ambulatory Visit | Attending: Radiation Oncology | Admitting: Radiation Oncology

## 2013-06-14 ENCOUNTER — Telehealth: Payer: Self-pay | Admitting: Radiation Oncology

## 2013-06-14 DIAGNOSIS — C8190 Hodgkin lymphoma, unspecified, unspecified site: Secondary | ICD-10-CM

## 2013-06-14 NOTE — Patient Instructions (Signed)
  Current NCCN Guidelines for follow-up of Hodgkins Disease:

## 2013-06-14 NOTE — Telephone Encounter (Signed)
Patient has not shown for 1145 appointment with Dr. Kathrynn Running. Phoned to assess patient status. Patient reports that she "totally forgot her appointment." She goes on to explain that she is planning her wedding and "caught up" in that. Denies complications. Denies that she has been seen by [facility] or desires to be seen at [facility]. Reports that she is happy with the services provided by Dr. Kathrynn Running. Provided patient with scheduler direct number and she plans to call back and reschedule.

## 2013-07-19 ENCOUNTER — Ambulatory Visit: Payer: BC Managed Care – PPO

## 2013-07-20 ENCOUNTER — Ambulatory Visit (HOSPITAL_COMMUNITY): Payer: BC Managed Care – PPO

## 2013-07-20 ENCOUNTER — Ambulatory Visit
Admission: RE | Admit: 2013-07-20 | Discharge: 2013-07-20 | Disposition: A | Discharge status: Home / Self Care | Payer: BC Managed Care – PPO | Source: Ambulatory Visit | Attending: Radiation Oncology | Admitting: Radiation Oncology

## 2013-07-31 ENCOUNTER — Telehealth: Payer: Self-pay | Admitting: Internal Medicine

## 2013-07-31 NOTE — Telephone Encounter (Signed)
lmonvm for pt on both home/cell phone re cancelling 10/23 breast mri appt only and pt will proceed w/other appts which were confirmed in the vm. Mri has gone to review re dx. After speaking w/desk nurse/Jeff H/darlena pt will proceed w/mammo 10/23 and lb/fu 11/13. Once pt sees Dr. Rosie Fate on 11/13 if it is determined that pt needs breast mri test will be reordered and scheduled appropriately. S/w Sheralyn Boatman in [place] to cx mri. Awaiting return call from pt. Pt given my direct # and asked to call me back ASAP.

## 2013-08-01 ENCOUNTER — Telehealth: Payer: Self-pay | Admitting: Internal Medicine

## 2013-08-01 ENCOUNTER — Other Ambulatory Visit: Payer: Self-pay | Admitting: Oncology

## 2013-08-01 DIAGNOSIS — C8190 Hodgkin lymphoma, unspecified, unspecified site: Secondary | ICD-10-CM

## 2013-08-01 NOTE — Telephone Encounter (Signed)
Pt returned my call and is aware breast mri has been cancelled and why. Pt understands if mri is warranted Dr. Rosie Fate will order the test once he sees her. Also per pt she needs to r/s mammo and she will call breast ctr to r/s. Pt aware mammo should be done before f/u w/Dr. Rosie Fate 11/13.

## 2013-08-02 ENCOUNTER — Ambulatory Visit (HOSPITAL_COMMUNITY): Payer: BC Managed Care – PPO

## 2013-08-02 ENCOUNTER — Ambulatory Visit: Payer: BC Managed Care – PPO

## 2013-08-20 ENCOUNTER — Ambulatory Visit
Admission: RE | Admit: 2013-08-20 | Discharge: 2013-08-20 | Disposition: A | Discharge status: Home / Self Care | Payer: BC Managed Care – PPO | Source: Ambulatory Visit | Attending: Oncology | Admitting: Oncology

## 2013-08-20 DIAGNOSIS — C8190 Hodgkin lymphoma, unspecified, unspecified site: Secondary | ICD-10-CM

## 2013-08-21 ENCOUNTER — Other Ambulatory Visit: Payer: BC Managed Care – PPO | Admitting: Lab

## 2013-08-21 ENCOUNTER — Ambulatory Visit: Payer: BC Managed Care – PPO | Admitting: Oncology

## 2013-08-23 ENCOUNTER — Encounter (INDEPENDENT_AMBULATORY_CARE_PROVIDER_SITE_OTHER): Payer: Self-pay

## 2013-08-23 ENCOUNTER — Other Ambulatory Visit (HOSPITAL_BASED_OUTPATIENT_CLINIC_OR_DEPARTMENT_OTHER): Payer: BC Managed Care – PPO | Admitting: Lab

## 2013-08-23 ENCOUNTER — Other Ambulatory Visit: Payer: Self-pay | Admitting: Internal Medicine

## 2013-08-23 ENCOUNTER — Encounter: Payer: Self-pay | Admitting: Internal Medicine

## 2013-08-23 ENCOUNTER — Ambulatory Visit (HOSPITAL_BASED_OUTPATIENT_CLINIC_OR_DEPARTMENT_OTHER): Payer: BC Managed Care – PPO | Admitting: Internal Medicine

## 2013-08-23 VITALS — BP 117/73 | HR 83 | Temp 97.8°F | Resp 20 | Ht 66.0 in | Wt 164.4 lb

## 2013-08-23 DIAGNOSIS — C8190 Hodgkin lymphoma, unspecified, unspecified site: Secondary | ICD-10-CM

## 2013-08-23 LAB — CBC WITH DIFFERENTIAL/PLATELET
Basophils Absolute: 0 10*3/uL (ref 0.0–0.1)
EOS%: 1.4 % (ref 0.0–7.0)
HCT: 38.9 % (ref 34.8–46.6)
HGB: 13.1 g/dL (ref 11.6–15.9)
LYMPH%: 35.2 % (ref 14.0–49.7)
MCH: 29 pg (ref 25.1–34.0)
MCV: 86.3 fL (ref 79.5–101.0)
MONO#: 0.5 10*3/uL (ref 0.1–0.9)
NEUT%: 56.6 % (ref 38.4–76.8)
Platelets: 244 10*3/uL (ref 145–400)
lymph#: 2.8 10*3/uL (ref 0.9–3.3)

## 2013-08-23 LAB — TSH CHCC: TSH: 2.937 m(IU)/L (ref 0.308–3.960)

## 2013-08-23 LAB — COMPREHENSIVE METABOLIC PANEL (CC13)
AST: 12 U/L (ref 5–34)
Anion Gap: 10 mEq/L (ref 3–11)
BUN: 11.4 mg/dL (ref 7.0–26.0)
Calcium: 9.8 mg/dL (ref 8.4–10.4)
Chloride: 110 mEq/L — ABNORMAL HIGH (ref 98–109)
Creatinine: 0.8 mg/dL (ref 0.6–1.1)
Glucose: 91 mg/dl (ref 70–140)
Potassium: 4.1 mEq/L (ref 3.5–5.1)
Total Bilirubin: 0.42 mg/dL (ref 0.20–1.20)

## 2013-08-23 LAB — LACTATE DEHYDROGENASE (CC13): LDH: 148 U/L (ref 125–245)

## 2013-08-24 ENCOUNTER — Telehealth: Payer: Self-pay | Admitting: Internal Medicine

## 2013-08-24 NOTE — Telephone Encounter (Signed)
s.w. pt and advised on Nov 2015 appt....pt ok and aware

## 2013-08-26 NOTE — Progress Notes (Signed)
[facility] OFFICE PROGRESS NOTE  Lane,[name], MD [address] [facility], [place] [address]  DIAGNOSIS: Hodgkin's disease - Plan: CBC with Differential, Comprehensive metabolic panel, Lactate dehydrogenase, MM Digital Diagnostic Bilat  Chief Complaint  Patient presents with  . Lymphoma   PRIOR THERAPY:  ABVD x 4 followed by XRT.   CURRENT THERAPY: Surveillance.  INTERVAL HISTORY: Lauren Lane 32 y.o. female with history of Stage 2 Hodgkin's disease is here for her annual follow-up.  She was last seen 08/16/2012 by Dr. Pierce Lane. She states she had a mammogram scheduled but had to cancel because she was unsure if she was pregnant at the time. She denies any recent hospitalizations or emergency room visits.  She denies any changes in constitutional symptoms such as fevers, night sweats or weight lost.  She recently gave birth to a baby boy over one year ago (August 2013) without complications..  She denies constipation or hair lost or weight gain.  Her last TSH was within normal limits.    MEDICAL HISTORY: Past Medical History  Diagnosis Date  . Hodgkin's disease 06/15/2012    INTERIM HISTORY: has Hodgkin's disease on her problem list.    ALLERGIES:  has No Known Allergies.  MEDICATIONS: currently has no medications in their medication list.  SURGICAL HISTORY: No past surgical history on file.  REVIEW OF SYSTEMS:   Constitutional: Denies fevers, chills or abnormal weight loss Eyes: Denies blurriness of vision Ears, nose, mouth, throat, and face: Denies mucositis or sore throat Respiratory: Denies cough, dyspnea or wheezes Cardiovascular: Denies palpitation, chest discomfort or lower extremity swelling Gastrointestinal:  Denies nausea, heartburn or change in bowel habits Skin: Denies abnormal skin rashes Lymphatics: Denies new lymphadenopathy or easy bruising Neurological:Denies numbness, tingling or new  weaknesses Behavioral/Psych: Mood is stable, no new changes  All other systems were reviewed with the patient and are negative.  PHYSICAL EXAMINATION: ECOG PERFORMANCE STATUS: 0 - Asymptomatic  Blood pressure 117/73, pulse 83, temperature 97.8 F (36.6 C), temperature source Oral, resp. rate 20, height 5\' 6"  (1.676 m), weight 164 lb 6.4 oz (74.571 kg).  GENERAL:alert, no distress and comfortable; Well-developed, well-nourished.  SKIN: skin color, texture, turgor are normal, no rashes or significant lesions EYES: normal, Conjunctiva are pink and non-injected, sclera clear OROPHARYNX:no exudate, no erythema and lips, buccal mucosa, and tongue normal  NECK: supple, thyroid normal size, non-tender, without nodularity LYMPH:  no palpable lymphadenopathy in the cervical, axillary or supraclavicular LUNGS: clear to auscultation and percussion with normal breathing effort HEART: regular rate & rhythm and no murmurs and no lower extremity edema ABDOMEN:abdomen soft, non-tender and normal bowel sounds Musculoskeletal:no cyanosis of digits and no clubbing  NEURO: alert & oriented x 3 with fluent speech, no focal motor/sensory deficits  Results for Lauren, Lane (MRN [id number]) as of 08/26/2013 13:09  Ref. Range 08/23/2013 13:31 08/23/2013 13:31  TSH Latest Range: 0.308-3.960 m(IU)/L 2.937   Free T4 Latest Range: 0.80-1.80 ng/dL  4.78    Labs:  Lab Results  Component Value Date   WBC 7.9 08/23/2013   HGB 13.1 08/23/2013   HCT 38.9 08/23/2013   MCV 86.3 08/23/2013   PLT 244 08/23/2013   NEUTROABS 4.5 08/23/2013      Chemistry      Component Value Date/Time   NA 140 08/23/2013 1331   NA 141 07/16/2011 0936   K 4.1 08/23/2013 1331   K 4.2 07/16/2011 0936   CL 108* 07/19/2012 0948  CL 105 07/16/2011 0936   CO2 21* 08/23/2013 1331   CO2 27 07/16/2011 0936   BUN 11.4 08/23/2013 1331   BUN 9 07/16/2011 0936   CREATININE 0.8 08/23/2013 1331   CREATININE 0.69 07/16/2011 0936       Component Value Date/Time   CALCIUM 9.8 08/23/2013 1331   CALCIUM 9.7 07/16/2011 0936   ALKPHOS 80 08/23/2013 1331   ALKPHOS 72 07/16/2011 0936   AST 12 08/23/2013 1331   AST 14 07/16/2011 0936   ALT 7 08/23/2013 1331   ALT 10 07/16/2011 0936   BILITOT 0.42 08/23/2013 1331   BILITOT 0.4 07/16/2011 0936     Basic Metabolic Panel:  Recent Labs Lab 08/23/13 1331  NA 140  K 4.1  CO2 21*  GLUCOSE 91  BUN 11.4  CREATININE 0.8  CALCIUM 9.8   GFR Estimated Creatinine Clearance: 106.2 ml/min (by C-G formula based on Cr of 0.8). Liver Function Tests:  Recent Labs Lab 08/23/13 1331  AST 12  ALT 7  ALKPHOS 80  BILITOT 0.42  PROT 6.9  ALBUMIN 3.8   CBC:  Recent Labs Lab 08/23/13 1331  WBC 7.9  NEUTROABS 4.5  HGB 13.1  HCT 38.9  MCV 86.3  PLT 244    RADIOGRAPHIC STUDIES: No results found.  ASSESSMENT: Lauren Lane 32 y.o. female with a history of Hodgkin's disease - Plan: CBC with Differential, Comprehensive metabolic panel, Lactate dehydrogenase, MM Digital Diagnostic Bilat   PLAN:  1. Stage 2 Nodular sclerosing HD --Diagnosed 08/22/2001 s/p ABVD x 4 followed by involved field XRT She is considered high-risk breast given history of of XRT. We again discussed f/u plans. She sees Dr Lauren Lane as well. She will be due for a mammogram this year and might benefit from an MRI q 2 years as well.  She should follow-up for CBC, CMP and LDH in one year.   All questions were answered. The patient knows to call the clinic with any problems, questions or concerns. We can certainly see the patient much sooner if necessary.  I spent 15 minutes counseling the patient face to face. The total time spent in the appointment was 25 minutes.    Lauren Golay, MD 08/26/2013 1:02 PM

## 2013-09-13 ENCOUNTER — Ambulatory Visit: Payer: BC Managed Care – PPO | Admitting: Radiation Oncology

## 2013-09-20 ENCOUNTER — Ambulatory Visit: Payer: BC Managed Care – PPO | Admitting: Radiation Oncology

## 2013-10-31 ENCOUNTER — Encounter: Payer: Self-pay | Admitting: Radiation Oncology

## 2013-10-31 NOTE — Progress Notes (Signed)
Radiation Oncology         (336) [phone] ________________________________  Name: Yasmin Bronaugh MRN: [id number]  Date: 11/01/2013  DOB: 1981-06-19  Follow-Up Visit Note  CC: Delight Stare, MD  Delight Stare, MD  Diagnosis:   33 year old woman status post ABVD x4 followed by involved-field radiotherapy for stage IIA nodular sclerosing Hodgkin disease.  Interval Since Last Radiation:  11 years  Narrative:  The patient returns today for routine follow-up.  She denies any recent hospitalizations or emergency room visits. She denies any changes in constitutional symptoms such as fevers, night sweats or weight lost. She gave birth to a baby boy August 5329 without complications.. She denies constipation or hair lost or weight gain. Her last TSH in 11/14 was within normal limits.   Patient reports that the only medication she is taking is vitamin d once per week. 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 Reports her TSH was drawn by her OB GYN and results are shown below. Reports she is attempting to get pregnant but, hasn't been successful. Reports she feels tired, crabby, and moody often. Reports episodes of racing heart and dizziness. Report cardiologist dx PAC but, she isn't receiving any treatment for this. Denies pain, nausea, vomiting, headache or dizziness                              ALLERGIES:  has No Known Allergies.  Meds: Current Outpatient Prescriptions  Medication Sig Dispense Refill  . Vitamin D, Ergocalciferol, (DRISDOL) 50000 UNITS CAPS capsule Take 50,000 Units by mouth every 7 (seven) days.       No current facility-administered medications for this encounter.    Physical Findings: The patient is in no acute distress. Patient is alert and oriented.  weight is 167 lb 6.4 oz (75.932 kg). Her oral temperature is 98 F (36.7 C). Her blood pressure is 118/72 and her pulse is 71. Her respiration is 18 and oxygen saturation is 100%. .  No significant changes.  Lab  Findings: Lab Results  Component Value Date   WBC 7.9 08/23/2013   HGB 13.1 08/23/2013   HCT 38.9 08/23/2013   MCV 86.3 08/23/2013   PLT 244 08/23/2013    @LASTCHEM @  Radiographic Findings: Results from 08/01/2013 to 11/01/2013  Table of Contents for Results from 08/01/2013 to 11/01/2013  Vitamin D 25 Hydroxy (25OH D2 + D3) (10/10/2013 10:48 AM EST)  CBC (10/10/2013 10:48 AM EST)  T4, Free (10/10/2013 10:48 AM EST)  TSH (10/10/2013 10:48 AM EST)  Vitamin D 25 Hydroxy (25OH D2 + D3) (10/10/2013 10:48 AM EST)  Vitamin D 25 Hydroxy (25OH D2 + D3) (10/10/2013 10:48 AM EST)   Component  Value  Range   Vitamin D Total (25OH)  16.8 (L)Comment:  The Vitamin D target value of 20 ng/mL is based on the 2010 IOM report and AAP recommendations. A level of 80 is the lowest reported level associated with toxicity, but most patients with clinical toxicity have levels above 150. Patients with renal  failure cannot convert Vitamin D to its active form, thus they may have even higher levels without clinical toxicity.  20.0-80.0 ng/mL    Vitamin D 25 Hydroxy (25OH D2 + D3) (10/10/2013 10:48 AM EST)   Specimen   Blood - Whole blood   Back to top of Results from 08/01/2013 to 11/01/2013    CBC (10/10/2013 10:48 AM EST)  CBC (10/10/2013 10:48 AM EST)  Component  Value  Range   Platelet Count  274  150-450 k/uL   RBC  4.60  3.90-5.03 M/uL   MCV  88.6  82.0-98.0 fL   Hemoglobin  13.7  12.0-15.5 g/dL   RDW  12.9  12.0-15.0 %   Auto WBC  6.7  3.5-10.5 k/uL   Hematocrit  40.7  35.0-44.0 %    CBC (10/10/2013 10:48 AM EST)   Specimen   Blood - Whole blood   Back to top of Results from 08/01/2013 to 11/01/2013    T4, Free (10/10/2013 10:48 AM EST)  T4, Free (10/10/2013 10:48 AM EST)   Component  Value  Range   T4 Free  0.98Comment: * Thyroid autoantibodies may interfere with this assay and result in falsely elevated free T4 values.  0.76-1.46 ng/dL    T4, Free (10/10/2013 10:48 AM EST)     Specimen   Blood - Whole blood   Back to top of Results from 08/01/2013 to 11/01/2013    TSH (10/10/2013 10:48 AM EST)  TSH (10/10/2013 10:48 AM EST)   Component  Value  Range   TSH  4.260 (H)  0.358-3.740 mIU/L    Impression:  The patient is NED.  She plans to get mammography coordinated by her OB Gyn  Plan:  Follow up in 11/15  _____________________________________  Sheral Apley. Tammi Klippel, M.D.

## 2013-11-01 ENCOUNTER — Encounter: Payer: Self-pay | Admitting: Radiation Oncology

## 2013-11-01 ENCOUNTER — Ambulatory Visit
Admission: RE | Admit: 2013-11-01 | Discharge: 2013-11-01 | Disposition: A | Discharge status: Home / Self Care | Payer: BC Managed Care – PPO | Source: Ambulatory Visit | Attending: Radiation Oncology | Admitting: Radiation Oncology

## 2013-11-01 VITALS — BP 118/72 | HR 71 | Temp 98.0°F | Resp 18 | Wt 167.4 lb

## 2013-11-01 DIAGNOSIS — C8190 Hodgkin lymphoma, unspecified, unspecified site: Secondary | ICD-10-CM

## 2013-11-01 NOTE — Progress Notes (Signed)
Results from 08/01/2013 to 11/01/2013  Table of Contents for Results from 08/01/2013 to 11/01/2013  Vitamin D 25 Hydroxy (25OH D2 + D3) (10/10/2013 10:48 AM EST)  CBC (10/10/2013 10:48 AM EST)  T4, Free (10/10/2013 10:48 AM EST)  TSH (10/10/2013 10:48 AM EST)  Vitamin D 25 Hydroxy (25OH D2 + D3) (10/10/2013 10:48 AM EST)  Vitamin D 25 Hydroxy (25OH D2 + D3) (10/10/2013 10:48 AM EST)  Component Value Range  Vitamin D Total (25OH) 16.8 (L)Comment:  The Vitamin D target value of 20 ng/mL is based on the 2010 IOM report and AAP recommendations. A level of 80 is the lowest reported level associated with toxicity, but most patients with clinical toxicity have levels above 150. Patients with renal  failure cannot convert Vitamin D to its active form, thus they may have even higher levels without clinical toxicity.  20.0-80.0 ng/mL   Vitamin D 25 Hydroxy (25OH D2 + D3) (10/10/2013 10:48 AM EST)  Specimen  Blood - Whole blood  Back to top of Results from 08/01/2013 to 11/01/2013    CBC (10/10/2013 10:48 AM EST)  CBC (10/10/2013 10:48 AM EST)  Component Value Range  Platelet Count 274 150-450 k/uL  RBC 4.60 3.90-5.03 M/uL  MCV 88.6 82.0-98.0 fL  Hemoglobin 13.7 12.0-15.5 g/dL  RDW 12.9 12.0-15.0 %  Auto WBC 6.7 3.5-10.5 k/uL  Hematocrit 40.7 35.0-44.0 %   CBC (10/10/2013 10:48 AM EST)  Specimen  Blood - Whole blood  Back to top of Results from 08/01/2013 to 11/01/2013    T4, Free (10/10/2013 10:48 AM EST)  T4, Free (10/10/2013 10:48 AM EST)  Component Value Range  T4 Free 0.98Comment: * Thyroid autoantibodies may interfere with this assay and result in falsely elevated free T4 values. 0.76-1.46 ng/dL   T4, Free (10/10/2013 10:48 AM EST)  Specimen  Blood - Whole blood  Back to top of Results from 08/01/2013 to 11/01/2013    TSH (10/10/2013 10:48 AM EST)  TSH (10/10/2013 10:48 AM EST)  Component Value Range  TSH 4.260 (H) 0.358-3.740 mIU/L

## 2013-11-01 NOTE — Progress Notes (Signed)
Patient reports that the only medication she is taking is vitamin d once per week. 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 Reports her TSH was drawn by her OB GYN but, she hasn't obtained results. Reports she is attempting to get pregnant but, hasn't been successful. Reports she feels tired, crabby, and moody often. Reports episodes of racing heart and dizziness. Report cardiologist dx PAC but, she isn't receiving any treatment for this. Denies pain, nausea, vomiting, headache or dizziness.

## 2014-08-21 ENCOUNTER — Telehealth: Payer: Self-pay | Admitting: Hematology

## 2014-08-21 NOTE — Telephone Encounter (Signed)
Pt called to cancel appt due to delivering her second baby. Will call back once at home.

## 2014-08-22 ENCOUNTER — Ambulatory Visit: Payer: BC Managed Care – PPO | Admitting: Radiation Oncology

## 2014-08-23 ENCOUNTER — Other Ambulatory Visit: Payer: BC Managed Care – PPO

## 2014-08-23 ENCOUNTER — Ambulatory Visit: Payer: BC Managed Care – PPO

## 2014-12-26 ENCOUNTER — Encounter: Payer: Self-pay | Admitting: Radiation Oncology

## 2014-12-26 ENCOUNTER — Ambulatory Visit
Admission: RE | Admit: 2014-12-26 | Discharge: 2014-12-26 | Disposition: A | Discharge status: Home / Self Care | Payer: BLUE CROSS/BLUE SHIELD | Source: Ambulatory Visit | Attending: Radiation Oncology | Admitting: Radiation Oncology

## 2014-12-26 VITALS — BP 120/73 | HR 89 | Temp 98.2°F | Resp 12 | Wt 171.5 lb

## 2014-12-26 DIAGNOSIS — C8190 Hodgkin lymphoma, unspecified, unspecified site: Secondary | ICD-10-CM

## 2014-12-26 NOTE — Progress Notes (Signed)
  Radiation Oncology         [phone] ________________________________  Name: Lauren Lane MRN: [id number]  Date: 12/26/2014  DOB: June 07, 1981  Follow-Up Visit Note  CC: Delight Stare, MD  Eston Esters, MD    Eulis Canner Ibach   Diagnosis:   34 year old woman status post ABVD x4 followed by involved-field radiotherapy for stage IIA nodular sclerosing Hodgkin disease in 3-01/2002  23.8 Gy in 14 fractions of 1.7 Gy to this anterior and a corresponding posterior field     6.8 Gy boost in 4 fractions of 1.7 Gy to this left anterior oblique and a corresponding right posterior oblique field.      Interval Since Last Radiation:  13 years  Narrative:  The patient returns today for routine follow-up.  She denies any recent hospitalizations or emergency room visits. She denies any changes in constitutional symptoms such as fevers, night sweats or weight lost. 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 She is currently in no pain. Pt complains of fatigue. Just gave birth in November 2015.  Reports chronic sore throats and seasonal allergies.  The patient is managed for hypothyroidism postradiation. She had an episode of palpitations or this year but negative cardiac workup.                              ALLERGIES:  is allergic to no known allergies.  Meds: Current Outpatient Prescriptions  Medication Sig Dispense Refill  . levothyroxine (SYNTHROID, LEVOTHROID) 50 MCG tablet Take 50 mcg by mouth once.    . Vitamin D, Ergocalciferol, (DRISDOL) 50000 UNITS CAPS capsule Take 50,000 Units by mouth every 7 (seven) days.     No current facility-administered medications for this encounter.    Physical Findings: The patient is in no acute distress. Patient is alert and oriented.  weight is 171 lb 8 oz (77.792 kg). Her oral temperature is 98.2 F (36.8 C). Her blood pressure is 120/73 and her pulse is 89. Her respiration is 12 and oxygen saturation is 99%. . The oropharynx is  symmetric with no asymmetry or lymphatic hyperplasia. The neck is free of any appreciable lymphadenopathy. The supra-auricular fossae are free of adenopathy. Lungs are clear heart is regular axillary free of adenopathy. Further detailed physical exams deferred today.  No significant changes.  Lab Findings: Lab Results  Component Value Date   WBC 7.9 08/23/2013   HGB 13.1 08/23/2013   HCT 38.9 08/23/2013   MCV 86.3 08/23/2013   PLT 244 08/23/2013   Radiographic Findings: The patient reports having a normal 3-D mammogram this past year.  Impression:  The patient is NED.    Plan:  Follow up in one year.  _____________________________________  Sheral Apley. Tammi Klippel, M.D.

## 2014-12-26 NOTE — Progress Notes (Signed)
She is currently in no pain. Pt complains of fatigue. Just gave birth in November 2015.  Reports one enlarged lymph node over the left knee region.  Fluctuates in size.  Noticed in the beginning of this month.  Reports chronic sore throats and seasonal allergies.  BP 120/73 mmHg  Pulse 89  Temp(Src) 98.2 F (36.8 C) (Oral)  Resp 12  Wt 171 lb 8 oz (77.792 kg)  SpO2 99%  LMP

## 2015-07-07 ENCOUNTER — Telehealth: Payer: Self-pay | Admitting: Radiation Oncology

## 2015-07-07 NOTE — Telephone Encounter (Signed)
Rec'd a message from patient asking for Dr. Tammi Klippel to refer her to Dr. Iva Boop at [facility]. I have inbasket messaged Dr. Tammi Klippel and London Pepper RN this message.

## 2015-07-09 ENCOUNTER — Other Ambulatory Visit: Payer: Self-pay | Admitting: Radiation Oncology

## 2015-07-09 DIAGNOSIS — C8190 Hodgkin lymphoma, unspecified, unspecified site: Secondary | ICD-10-CM

## 2015-07-11 ENCOUNTER — Telehealth: Payer: Self-pay | Admitting: *Deleted

## 2015-07-11 NOTE — Telephone Encounter (Signed)
CALLED PATIENT TO INFORM THAT NOTES HAVE BEEN FAXED TO DR. CRANE'S OFFICE IN [place], SPOKE WITH PATIENT AND SHE IS AWARE OF THIS.

## 2015-07-25 ENCOUNTER — Telehealth: Payer: Self-pay | Admitting: *Deleted

## 2015-07-25 NOTE — Telephone Encounter (Signed)
On 07-25-15 fax medical records to [facility] , it was consult note, sim & tx planning note, end of tx note, follow up note.

## 2015-12-18 ENCOUNTER — Ambulatory Visit
Admission: RE | Admit: 2015-12-18 | Payer: BLUE CROSS/BLUE SHIELD | Source: Ambulatory Visit | Admitting: Radiation Oncology

## 2015-12-30 ENCOUNTER — Telehealth: Payer: Self-pay | Admitting: Radiation Oncology

## 2015-12-30 NOTE — Telephone Encounter (Signed)
Phoned patient to inquire about her status and ensure she is linked in with [facility]. Left message requesting return call on patient's cell.

## 2016-01-06 ENCOUNTER — Telehealth: Payer: Self-pay | Admitting: Radiation Oncology

## 2016-01-06 NOTE — Telephone Encounter (Signed)
Phoned patient to inquire if she is hooked in with [facility]. No answer left message requesting return call. Also, emailed patient.
